# Patient Record
Sex: Male | Born: 1945
Health system: Southern US, Community
[De-identification: ages and names within clinical notes are randomized; demographics above are authoritative.]

## PROBLEM LIST (undated history)

## (undated) DIAGNOSIS — I10 Essential (primary) hypertension: Secondary | ICD-10-CM

---

## 2012-03-03 DIAGNOSIS — I10 Essential (primary) hypertension: Secondary | ICD-10-CM | POA: Diagnosis not present

## 2012-03-03 DIAGNOSIS — Z23 Encounter for immunization: Secondary | ICD-10-CM | POA: Diagnosis not present

## 2012-03-19 DIAGNOSIS — L738 Other specified follicular disorders: Secondary | ICD-10-CM | POA: Diagnosis not present

## 2012-05-30 DIAGNOSIS — E782 Mixed hyperlipidemia: Secondary | ICD-10-CM | POA: Diagnosis not present

## 2013-03-13 DIAGNOSIS — Z23 Encounter for immunization: Secondary | ICD-10-CM | POA: Diagnosis not present

## 2013-03-13 DIAGNOSIS — I10 Essential (primary) hypertension: Secondary | ICD-10-CM | POA: Diagnosis not present

## 2013-03-13 DIAGNOSIS — Z6827 Body mass index (BMI) 27.0-27.9, adult: Secondary | ICD-10-CM | POA: Diagnosis not present

## 2013-06-26 DEATH — deceased

## 2013-09-21 DIAGNOSIS — H6690 Otitis media, unspecified, unspecified ear: Secondary | ICD-10-CM | POA: Diagnosis not present

## 2013-09-21 DIAGNOSIS — Z6826 Body mass index (BMI) 26.0-26.9, adult: Secondary | ICD-10-CM | POA: Diagnosis not present

## 2013-09-21 DIAGNOSIS — J0100 Acute maxillary sinusitis, unspecified: Secondary | ICD-10-CM | POA: Diagnosis not present

## 2013-09-27 ENCOUNTER — Emergency Department (HOSPITAL_COMMUNITY)
Admission: EM | Admit: 2013-09-27 | Discharge: 2013-09-27 | Disposition: A | Discharge status: Home / Self Care | Payer: Medicare Other | Attending: Emergency Medicine | Admitting: Emergency Medicine

## 2013-09-27 ENCOUNTER — Encounter (HOSPITAL_COMMUNITY): Payer: Self-pay | Admitting: Emergency Medicine

## 2013-09-27 DIAGNOSIS — H65199 Other acute nonsuppurative otitis media, unspecified ear: Secondary | ICD-10-CM | POA: Insufficient documentation

## 2013-09-27 DIAGNOSIS — H65192 Other acute nonsuppurative otitis media, left ear: Secondary | ICD-10-CM

## 2013-09-27 DIAGNOSIS — R42 Dizziness and giddiness: Secondary | ICD-10-CM | POA: Diagnosis not present

## 2013-09-27 MED ORDER — ANTIPYRINE-BENZOCAINE 5.4-1.4 % OT SOLN: 3.0000 [drp] | Freq: Once | OTIC | Status: DC

## 2013-09-27 MED ORDER — NEOMYCIN-POLYMYXIN-HC 3.5-10000-1 OT SUSP
3.0000 [drp] | Freq: Once | OTIC | Status: AC
  Administered 2013-09-27: 3 [drp] via OTIC
  Filled 2013-09-27 (×2): qty 10

## 2013-09-27 MED ORDER — AMOXICILLIN-POT CLAVULANATE 875-125 MG PO TABS: 1.0000 | ORAL_TABLET | Freq: Two times a day (BID) | ORAL | Status: DC

## 2013-09-27 NOTE — Discharge Instructions (Signed)
Otitis Media °Otitis media is redness, soreness, and puffiness (swelling) in the space just behind your eardrum (middle ear). It may be caused by allergies or infection. It often happens along with a cold. °HOME CARE °· Take your medicine as told. Finish it even if you start to feel better. °· Only take over-the-counter or prescription medicines for pain, discomfort, or fever as told by your doctor. °· Follow up with your doctor as told. °GET HELP IF: °· You have otitis media only in one ear, or bleeding from your nose, or both. °· You notice a lump on your neck. °· You are not getting better in 3-5 days. °· You feel worse instead of better. °GET HELP RIGHT AWAY IF:  °· You have pain that is not helped with medicine. °· You have puffiness, redness, or pain around your ear. °· You get a stiff neck. °· You cannot move part of your face (paralysis). °· You notice that the bone behind your ear hurts when you touch it. °MAKE SURE YOU:  °· Understand these instructions. °· Will watch your condition. °· Will get help right away if you are not doing well or get worse. °Document Released: 08/31/2007 Document Revised: 03/19/2013 Document Reviewed: 10/09/2012 °[employer]® Patient Information ©2015 [employer]. This information is not intended to replace advice given to you by your health care provider. Make sure you discuss any questions you have with your health care provider. ° °

## 2013-09-27 NOTE — ED Notes (Signed)
Notified pharmacy for otic suspension which is not currently in pyxis

## 2013-09-27 NOTE — ED Provider Notes (Signed)
CSN: [id number]     Arrival date & time 09/27/13  0912 History   First MD Initiated Contact with Patient 09/27/13 [phone]     Chief Complaint  Patient presents with  . Otalgia     (Consider location/radiation/quality/duration/timing/severity/associated sxs/prior Treatment) Patient is a 68 y.o. male presenting with ear pain. The history is provided by the patient.  Otalgia Location:  Left Behind ear:  No abnormality Quality:  Pressure and aching Severity:  Moderate Onset quality:  Gradual Duration:  1 week Timing:  Constant Progression:  Unchanged Chronicity:  New Context: not direct blow, not foreign body in ear and not loud noise   Relieved by:  Nothing Worsened by:  Nothing tried Ineffective treatments: zithromax and "sweet oil" Associated symptoms: hearing loss   Associated symptoms: no abdominal pain, no congestion, no cough, no ear discharge, no fever, no headaches, no neck pain, no rash, no rhinorrhea, no sore throat, no tinnitus and no vomiting   Risk factors: no chronic ear infection     Patient reports decreased hearing and pain to the left ear for one week.  He reports having URI symptoms at onset, but that has since resolved.  He was seen by his PCP and given Z-pack and finished the medications w/o relief.  He also reports having intermittent dizziness and "poping " sensation to his ear.  He denies fever, vomiting, headaches, neck pain or drainage from the ear.   History reviewed. No pertinent past medical history. History reviewed. No pertinent past surgical history. History reviewed. No pertinent family history. History  Substance Use Topics  . Smoking status: Never Smoker   . Smokeless tobacco: Not on file  . Alcohol Use: No    Review of Systems  Constitutional: Negative for fever, chills, activity change and appetite change.  HENT: Positive for ear pain and hearing loss. Negative for congestion, ear discharge, facial swelling, rhinorrhea, sinus pressure,  sneezing, sore throat, tinnitus, trouble swallowing and voice change.   Eyes: Negative for visual disturbance.  Respiratory: Negative for cough, shortness of breath, wheezing and stridor.   Gastrointestinal: Negative for nausea, vomiting and abdominal pain.  Musculoskeletal: Negative for neck pain and neck stiffness.  Skin: Negative.  Negative for rash.  Neurological: Positive for dizziness. Negative for syncope, facial asymmetry, speech difficulty, weakness, numbness and headaches.  Hematological: Negative for adenopathy.  Psychiatric/Behavioral: Negative for confusion.  All other systems reviewed and are negative.     Allergies  Review of patient's allergies indicates no known allergies.  Home Medications   Prior to Admission medications   Not on File   BP 128/91  Pulse 69  Temp(Src) 98.2 F (36.8 C) (Oral)  Resp 18  Ht 6\' 3"  (1.905 m)  Wt 220 lb (99.791 kg)  BMI 27.50 kg/m2  SpO2 99% Physical Exam  Nursing note and vitals reviewed. Constitutional: He is oriented to person, place, and time. He appears well-developed and well-nourished. No distress.  HENT:  Head: Normocephalic and atraumatic.  Mouth/Throat: Uvula is midline, oropharynx is clear and moist and mucous membranes are normal. No uvula swelling. No oropharyngeal exudate.  Erythema mild bulging of the left TM.  Mild middle ear effusion present.  Scant amt of dried blood at the 3 o'clock position of the TM.  No active bleeding or obvious perforation.  No edema or drainage of the canal.  Neck: Normal range of motion. Neck supple.  Cardiovascular: Normal rate, regular rhythm, normal heart sounds and intact distal pulses.   No murmur heard.  Pulmonary/Chest: Effort normal and breath sounds normal. No stridor. No respiratory distress.  Lymphadenopathy:    He has no cervical adenopathy.  Neurological: He is alert and oriented to person, place, and time. Coordination normal.  Skin: Skin is warm and dry. No rash noted.     ED Course  Procedures (including critical care time) Labs Review Labs Reviewed - No data to display  Imaging Review No results found.   EKG Interpretation None      MDM   Final diagnoses:  Other acute nonsuppurative otitis media of left ear   Pt is well appearing, ambulates with steady gait.  VSS.  Left ear pain and decreased hearing for one week.  Has small amt of blood at the TM that could represent a possible perforation.  Pt dispensed cortisporin otic susp and agrees to ENT f/u if the sx's are not improving.  Ambulates with steady gait.  No dizziness or vertiginous sx's.  appears stable for /c    Wyn Nettle L. Trisha Mangleriplett, PA-C 09/29/13 1844

## 2013-09-27 NOTE — ED Notes (Signed)
Seen last Friday for his left ear being stopped up and cold symptoms.  Placed on an antibiodic and states his ear is still stopped up.

## 2013-09-30 NOTE — ED Provider Notes (Signed)
Medical screening examination/treatment/procedure(s) were performed by non-physician practitioner and as supervising physician I was immediately available for consultation/collaboration.   EKG Interpretation None        Joya Gaskinsonald W Paulina Muchmore, MD 09/30/13 [phone]

## 2014-06-16 DIAGNOSIS — I10 Essential (primary) hypertension: Secondary | ICD-10-CM | POA: Diagnosis not present

## 2014-06-16 DIAGNOSIS — E782 Mixed hyperlipidemia: Secondary | ICD-10-CM | POA: Diagnosis not present

## 2014-06-16 DIAGNOSIS — Z681 Body mass index (BMI) 19 or less, adult: Secondary | ICD-10-CM | POA: Diagnosis not present

## 2014-09-09 DIAGNOSIS — R42 Dizziness and giddiness: Secondary | ICD-10-CM | POA: Diagnosis not present

## 2014-10-14 DIAGNOSIS — H40033 Anatomical narrow angle, bilateral: Secondary | ICD-10-CM | POA: Diagnosis not present

## 2014-10-14 DIAGNOSIS — H04123 Dry eye syndrome of bilateral lacrimal glands: Secondary | ICD-10-CM | POA: Diagnosis not present

## 2015-07-23 DIAGNOSIS — E441 Mild protein-calorie malnutrition: Secondary | ICD-10-CM | POA: Diagnosis not present

## 2015-07-23 DIAGNOSIS — E782 Mixed hyperlipidemia: Secondary | ICD-10-CM | POA: Diagnosis not present

## 2015-07-23 DIAGNOSIS — E663 Overweight: Secondary | ICD-10-CM | POA: Diagnosis not present

## 2015-07-23 DIAGNOSIS — Z1389 Encounter for screening for other disorder: Secondary | ICD-10-CM | POA: Diagnosis not present

## 2015-07-23 DIAGNOSIS — Z125 Encounter for screening for malignant neoplasm of prostate: Secondary | ICD-10-CM | POA: Diagnosis not present

## 2015-07-23 DIAGNOSIS — I10 Essential (primary) hypertension: Secondary | ICD-10-CM | POA: Diagnosis not present

## 2015-07-23 DIAGNOSIS — Z6826 Body mass index (BMI) 26.0-26.9, adult: Secondary | ICD-10-CM | POA: Diagnosis not present

## 2016-06-05 ENCOUNTER — Encounter (HOSPITAL_COMMUNITY): Payer: Self-pay | Admitting: Emergency Medicine

## 2016-06-05 ENCOUNTER — Emergency Department (HOSPITAL_COMMUNITY)
Admission: EM | Admit: 2016-06-05 | Discharge: 2016-06-05 | Disposition: A | Discharge status: Home / Self Care | Payer: Medicare Other | Attending: Emergency Medicine | Admitting: Emergency Medicine

## 2016-06-05 DIAGNOSIS — I10 Essential (primary) hypertension: Secondary | ICD-10-CM | POA: Insufficient documentation

## 2016-06-05 DIAGNOSIS — Z79899 Other long term (current) drug therapy: Secondary | ICD-10-CM | POA: Insufficient documentation

## 2016-06-05 DIAGNOSIS — R42 Dizziness and giddiness: Secondary | ICD-10-CM

## 2016-06-05 DIAGNOSIS — E860 Dehydration: Secondary | ICD-10-CM

## 2016-06-05 HISTORY — DX: Essential (primary) hypertension: I10

## 2016-06-05 LAB — URINALYSIS, ROUTINE W REFLEX MICROSCOPIC
Bilirubin Urine: NEGATIVE
Glucose, UA: NEGATIVE mg/dL
Hgb urine dipstick: NEGATIVE
KETONES UR: NEGATIVE mg/dL
LEUKOCYTES UA: NEGATIVE
NITRITE: NEGATIVE
Protein, ur: NEGATIVE mg/dL
Specific Gravity, Urine: 1.012 (ref 1.005–1.030)
pH: 5 (ref 5.0–8.0)

## 2016-06-05 LAB — BASIC METABOLIC PANEL
Anion gap: 9 (ref 5–15)
BUN: 16 mg/dL (ref 6–20)
CO2: 26 mmol/L (ref 22–32)
CREATININE: 1.27 mg/dL — AB (ref 0.61–1.24)
Calcium: 9.3 mg/dL (ref 8.9–10.3)
Chloride: 103 mmol/L (ref 101–111)
GFR calc Af Amer: >60 mL/min (ref >60)
GFR, EST NON AFRICAN AMERICAN: 55 mL/min — AB (ref >60)
Glucose, Bld: 107 mg/dL — ABNORMAL HIGH (ref 65–99)
Potassium: 3.6 mmol/L (ref 3.5–5.1)
Sodium: 138 mmol/L (ref 135–145)

## 2016-06-05 LAB — CBG MONITORING, ED: Glucose-Capillary: 112 mg/dL — ABNORMAL HIGH (ref 65–99)

## 2016-06-05 LAB — CBC
HCT: 43.7 % (ref 39.0–52.0)
Hemoglobin: 14.7 g/dL (ref 13.0–17.0)
MCH: 31.5 pg (ref 26.0–34.0)
MCHC: 33.6 g/dL (ref 30.0–36.0)
MCV: 93.8 fL (ref 78.0–100.0)
Platelets: 222 10*3/uL (ref 150–400)
RBC: 4.66 MIL/uL (ref 4.22–5.81)
RDW: 12.1 % (ref 11.5–15.5)
WBC: 7.1 10*3/uL (ref 4.0–10.5)

## 2016-06-05 NOTE — ED Notes (Signed)
Pt reports that he went up for the alter call and was standing with his legs straight for about 10 minutes when he became dizzy-  He reports that he feels fine now- but his wife brought him here to be checked out

## 2016-06-05 NOTE — ED Notes (Signed)
Pt unable to stand at this time due to dizziness

## 2016-06-05 NOTE — ED Provider Notes (Signed)
[facility]-EMERGENCY DEPT Provider Note   CSN: [id number] Arrival date & time: 06/05/16  1318     History   Chief Complaint Chief Complaint  Patient presents with  . Dizziness    HPI Keith Phillips is a 71 y.o. male.  HPI Patient presents to the emergency department with episode of lightheadedness and dizziness while standing in church for 10 minutes straight.  Family reports the patient looked pale and weak.  He sat down and drink juice and began feeling better.  He denies recent illness.  He awoke and felt findings morning.  No preceding chest pain or palpitations.  No loss consciousness.  No history of similar symptoms.  He does drink coffee 3 times a day.  He did not drink anything this morning nor did he have coffee.  He did have a small breakfast.  No complaints at this time.  He feels fine lying in bed.  No melena or hematochezia described.  No use of anticoagulants.  No chest pain or chest pressure at this time.  No other complaints.  Family reports he appears normal to them at this time   Past Medical History:  Diagnosis Date  . Hypertension     There are no active problems to display for this patient.   History reviewed. No pertinent surgical history.     Home Medications    Prior to Admission medications   Medication Sig Start Date End Date Taking? Authorizing Provider  atenolol (TENORMIN) 50 MG tablet Take 50 mg by mouth daily.   Yes Historical Provider, MD  lisinopril-hydrochlorothiazide (PRINZIDE,ZESTORETIC) 20-12.5 MG tablet Take 1 tablet by mouth daily.   Yes Historical Provider, MD    Family History No family history on file.  Social History Social History  Substance Use Topics  . Smoking status: Never Smoker  . Smokeless tobacco: Never Used  . Alcohol use No     Allergies   Patient has no known allergies.   Review of Systems Review of Systems  All other systems reviewed and are negative.    Physical Exam Updated Vital Signs BP 128/68 (BP  Location: Right Arm)   Pulse 72   Temp 97.9 F (36.6 C)   Resp 19   Ht 6\' 3"  (1.905 m)   Wt 215 lb (97.5 kg)   SpO2 100%   BMI 26.87 kg/m   Physical Exam  Constitutional: He is oriented to person, place, and time. He appears well-developed and well-nourished.  HENT:  Head: Normocephalic and atraumatic.  Eyes: EOM are normal.  Neck: Normal range of motion.  Cardiovascular: Normal rate, regular rhythm, normal heart sounds and intact distal pulses.   Pulmonary/Chest: Effort normal and breath sounds normal. No respiratory distress.  Abdominal: Soft. He exhibits no distension. There is no tenderness.  Musculoskeletal: Normal range of motion.  Neurological: He is alert and oriented to person, place, and time.  Skin: Skin is warm and dry.  Psychiatric: He has a normal mood and affect. Judgment normal.  Nursing note and vitals reviewed.    ED Treatments / Results  Labs (all labs ordered are listed, but only abnormal results are displayed) Labs Reviewed  BASIC METABOLIC PANEL - Abnormal; Notable for the following:       Result Value   Glucose, Bld 107 (*)    Creatinine, Ser 1.27 (*)    GFR calc non Af Amer 55 (*)    All other components within normal limits  CBG MONITORING, ED - Abnormal; Notable for the following:  Glucose-Capillary 112 (*)    All other components within normal limits  CBC  URINALYSIS, ROUTINE W REFLEX MICROSCOPIC    EKG  EKG Interpretation None       Radiology No results found.  Procedures Procedures (including critical care time)  Medications Ordered in ED Medications - No data to display   Initial Impression / Assessment and Plan / ED Course  I have reviewed the triage vital signs and the nursing notes.  Pertinent labs & imaging results that were available during my care of the patient were reviewed by me and considered in my medical decision making (see chart for details).     Prolonged standing and likely orthostatic hypotension as  the cause of his near syncope.  Workup in emergency department was without abnormality.  He stands at the bedside now without any difficulty.  Is keeping fluids down.  I recommended increasing water intake at home and decreasing caffeine intake  Post primary care doctor follow-up.  He understands to return to the ER for new or worsening symptoms  Final Clinical Impressions(s) / ED Diagnoses   Final diagnoses:  Dizziness  Dehydration    New Prescriptions New Prescriptions   No medications on file     Azalia BilisKevin Hind Chesler, MD 06/05/16 1717

## 2016-06-05 NOTE — ED Triage Notes (Signed)
Pt c/o near syncopal episode in church with dizziness. pt family reports pt looked pale and weak. Pt states he sat down, drank juice and ate a piece of candy and began to feel better, cbg-104 after. Denies dizziness at present. Alert and oriented x 4, speech clear, grips/stregths equal x 4. Pt denies ha/cp.

## 2016-09-01 DIAGNOSIS — Z1389 Encounter for screening for other disorder: Secondary | ICD-10-CM | POA: Diagnosis not present

## 2016-09-01 DIAGNOSIS — Z0000 Encounter for general adult medical examination without abnormal findings: Secondary | ICD-10-CM | POA: Diagnosis not present

## 2016-09-01 DIAGNOSIS — I10 Essential (primary) hypertension: Secondary | ICD-10-CM | POA: Diagnosis not present

## 2016-09-01 DIAGNOSIS — Z6826 Body mass index (BMI) 26.0-26.9, adult: Secondary | ICD-10-CM | POA: Diagnosis not present

## 2016-09-01 DIAGNOSIS — Z125 Encounter for screening for malignant neoplasm of prostate: Secondary | ICD-10-CM | POA: Diagnosis not present

## 2016-10-20 DIAGNOSIS — H04123 Dry eye syndrome of bilateral lacrimal glands: Secondary | ICD-10-CM | POA: Diagnosis not present

## 2016-10-20 DIAGNOSIS — H40033 Anatomical narrow angle, bilateral: Secondary | ICD-10-CM | POA: Diagnosis not present

## 2017-05-01 ENCOUNTER — Telehealth (INDEPENDENT_AMBULATORY_CARE_PROVIDER_SITE_OTHER): Payer: Self-pay | Admitting: *Deleted

## 2017-05-01 NOTE — Telephone Encounter (Signed)
[name], I have never seen this patient

## 2017-05-01 NOTE — Telephone Encounter (Signed)
[facility] no answer left message.

## 2017-05-01 NOTE — Telephone Encounter (Signed)
Carollee HerterShannon from [facility] called stating the patient told her that Terri sent an order to have her hemoglobin recollected. Per Carollee HerterShannon they have not received that order and she can take a verbal order. Please call Carollee HerterShannon at [phone]

## 2017-10-26 ENCOUNTER — Other Ambulatory Visit: Payer: Self-pay

## 2018-04-11 DIAGNOSIS — Z0000 Encounter for general adult medical examination without abnormal findings: Secondary | ICD-10-CM | POA: Diagnosis not present

## 2018-04-11 DIAGNOSIS — E785 Hyperlipidemia, unspecified: Secondary | ICD-10-CM | POA: Diagnosis not present

## 2018-04-11 DIAGNOSIS — E663 Overweight: Secondary | ICD-10-CM | POA: Diagnosis not present

## 2018-04-11 DIAGNOSIS — Z0001 Encounter for general adult medical examination with abnormal findings: Secondary | ICD-10-CM | POA: Diagnosis not present

## 2018-04-11 DIAGNOSIS — Z6826 Body mass index (BMI) 26.0-26.9, adult: Secondary | ICD-10-CM | POA: Diagnosis not present

## 2018-04-11 DIAGNOSIS — Z1389 Encounter for screening for other disorder: Secondary | ICD-10-CM | POA: Diagnosis not present

## 2019-04-19 ENCOUNTER — Ambulatory Visit: Payer: Medicare Other | Attending: Internal Medicine

## 2019-04-19 DIAGNOSIS — Z23 Encounter for immunization: Secondary | ICD-10-CM

## 2019-04-19 NOTE — Progress Notes (Signed)
   Covid-19 Vaccination Clinic  Name:  Keith Phillips    MRN: [id number] DOB: 08/11/1945  04/19/2019  [name] was observed post Covid-19 immunization for 15 minutes without incidence. He was provided with Vaccine Information Sheet and instruction to access the V-Safe system.   [name] was instructed to call 911 with any severe reactions post vaccine: [name] Kitchen Difficulty breathing  . Swelling of your face and throat  . A fast heartbeat  . A bad rash all over your body  . Dizziness and weakness    Immunizations Administered    Name Date Dose VIS Date Route   Pfizer COVID-19 Vaccine 04/19/2019  8:28 AM 0.3 mL 03/08/2019 Intramuscular   Manufacturer: [employer], [employer]   Lot: [id number]   NDC: [address]-0

## 2019-05-09 ENCOUNTER — Ambulatory Visit: Payer: Medicare Other | Attending: Internal Medicine

## 2019-05-09 DIAGNOSIS — Z23 Encounter for immunization: Secondary | ICD-10-CM | POA: Insufficient documentation

## 2019-05-09 NOTE — Progress Notes (Signed)
   Covid-19 Vaccination Clinic  Name:  Keith Phillips    MRN: [id number] DOB: 02-Jan-1946  05/09/2019  Keith Phillips was observed post Covid-19 immunization for 15 minutes without incidence. He was provided with Vaccine Information Sheet and instruction to access the V-Safe system.   Keith Phillips was instructed to call 911 with any severe reactions post vaccine: [name] Kitchen Difficulty breathing  . Swelling of your face and throat  . A fast heartbeat  . A bad rash all over your body  . Dizziness and weakness    Immunizations Administered    Name Date Dose VIS Date Route   Pfizer COVID-19 Vaccine 05/09/2019 12:44 PM 0.3 mL 03/08/2019 Intramuscular   Manufacturer: [employer], [employer]   Lot: [id number]   NDC: [address]-8

## 2019-07-08 DIAGNOSIS — I10 Essential (primary) hypertension: Secondary | ICD-10-CM | POA: Diagnosis not present

## 2019-07-08 DIAGNOSIS — E663 Overweight: Secondary | ICD-10-CM | POA: Diagnosis not present

## 2019-07-08 DIAGNOSIS — Z0000 Encounter for general adult medical examination without abnormal findings: Secondary | ICD-10-CM | POA: Diagnosis not present

## 2019-07-08 DIAGNOSIS — Z1389 Encounter for screening for other disorder: Secondary | ICD-10-CM | POA: Diagnosis not present

## 2019-07-08 DIAGNOSIS — Z6827 Body mass index (BMI) 27.0-27.9, adult: Secondary | ICD-10-CM | POA: Diagnosis not present

## 2019-07-08 DIAGNOSIS — E7849 Other hyperlipidemia: Secondary | ICD-10-CM | POA: Diagnosis not present

## 2020-01-02 ENCOUNTER — Ambulatory Visit: Payer: Medicare Other | Attending: Internal Medicine

## 2020-01-02 DIAGNOSIS — Z23 Encounter for immunization: Secondary | ICD-10-CM

## 2020-01-02 NOTE — Progress Notes (Signed)
   Covid-19 Vaccination Clinic  Name:  Keith Phillips    MRN: [id number] DOB: May 20, 1945  01/02/2020  Keith Phillips was observed post Covid-19 immunization for 15 minutes without incident. He was provided with Vaccine Information Sheet and instruction to access the V-Safe system.   Keith Phillips was instructed to call 911 with any severe reactions post vaccine: [name] Kitchen Difficulty breathing  . Swelling of face and throat  . A fast heartbeat  . A bad rash all over body  . Dizziness and weakness

## 2020-12-03 ENCOUNTER — Emergency Department (HOSPITAL_COMMUNITY)
Admission: EM | Admit: 2020-12-03 | Discharge: 2020-12-04 | Disposition: A | Discharge status: Home / Self Care | Payer: Medicare Other | Attending: Emergency Medicine | Admitting: Emergency Medicine

## 2020-12-03 ENCOUNTER — Other Ambulatory Visit: Payer: Self-pay

## 2020-12-03 ENCOUNTER — Encounter (HOSPITAL_COMMUNITY): Payer: Self-pay

## 2020-12-03 DIAGNOSIS — R066 Hiccough: Secondary | ICD-10-CM | POA: Insufficient documentation

## 2020-12-03 DIAGNOSIS — Z79899 Other long term (current) drug therapy: Secondary | ICD-10-CM | POA: Diagnosis not present

## 2020-12-03 DIAGNOSIS — I10 Essential (primary) hypertension: Secondary | ICD-10-CM | POA: Insufficient documentation

## 2020-12-03 NOTE — ED Triage Notes (Signed)
Pt arrived via POV with c/o hiccups x 5 hours. Denies any pain.

## 2020-12-04 ENCOUNTER — Emergency Department (HOSPITAL_COMMUNITY): Payer: Medicare Other

## 2020-12-04 DIAGNOSIS — I10 Essential (primary) hypertension: Secondary | ICD-10-CM | POA: Diagnosis not present

## 2020-12-04 DIAGNOSIS — R066 Hiccough: Secondary | ICD-10-CM | POA: Diagnosis not present

## 2020-12-04 MED ORDER — ONDANSETRON 8 MG PO TBDP
8.0000 mg | ORAL_TABLET | Freq: Once | ORAL | Status: AC
Start: 2020-12-04 — End: 2020-12-04
  Administered 2020-12-04: 8 mg via ORAL
  Filled 2020-12-04: qty 1

## 2020-12-04 MED ORDER — PANTOPRAZOLE SODIUM 40 MG PO TBEC
40.0000 mg | DELAYED_RELEASE_TABLET | Freq: Once | ORAL | Status: AC
  Administered 2020-12-04: 40 mg via ORAL
  Filled 2020-12-04: qty 1

## 2020-12-04 MED ORDER — OMEPRAZOLE 20 MG PO CPDR: 20.0000 mg | DELAYED_RELEASE_CAPSULE | Freq: Every day | ORAL | 0 refills | Status: AC

## 2020-12-04 NOTE — ED Provider Notes (Signed)
[facility] EMERGENCY DEPARTMENT Provider Note   CSN: [id number] Arrival date & time: 12/03/20  2252     History Chief Complaint  Patient presents with   Hiccups    Keith Phillips is a 75 y.o. male.  The history is provided by the patient.  Patient presents for hiccups.  Patient reports he ate a cheese steak from [employer], and 30 minutes later he began having hiccups.  No chest pain or shortness of breath.  No fevers or vomiting.  No reflux symptoms are reported. He has tried home remedies without success His course is stable, nothing improves his symptoms    Past Medical History:  Diagnosis Date   Hypertension       Social History   Tobacco Use   Smoking status: Never   Smokeless tobacco: Never  Substance Use Topics   Alcohol use: No   Drug use: No    Home Medications Prior to Admission medications   Medication Sig Start Date End Date Taking? Authorizing Provider  omeprazole (PRILOSEC) 20 MG capsule Take 1 capsule (20 mg total) by mouth daily. 12/04/20  Yes Zadie Rhine, MD  atenolol (TENORMIN) 50 MG tablet Take 50 mg by mouth daily.    [provider]  lisinopril-hydrochlorothiazide (PRINZIDE,ZESTORETIC) 20-12.5 MG tablet Take 1 tablet by mouth daily.    [provider]    Allergies    Patient has no known allergies.  Review of Systems   Review of Systems  Constitutional:  Negative for fever.  HENT:  Negative for sore throat.   Respiratory:  Negative for shortness of breath.   Cardiovascular:  Negative for chest pain.  Gastrointestinal:  Negative for vomiting.   Physical Exam Updated Vital Signs BP 131/84   Pulse 61   Temp 98.6 F (37 C) (Oral)   Resp 18   Ht 1.905 m (6\' 3" )   Wt 95.3 kg   SpO2 99%   BMI 26.25 kg/m   Physical Exam CONSTITUTIONAL: Elderly, no acute distress HEAD: Normocephalic/atraumatic EYES: EOMI/PERRL ENMT: Mucous membranes moist, uvula midline no stridor, no drooling NECK: supple no meningeal signs CV:  S1/S2 noted, no murmurs/rubs/gallops noted LUNGS: Lungs are clear to auscultation bilaterally, no apparent distress, hiccups frequently during exam ABDOMEN: soft NEURO: Pt is awake/alert/appropriate, moves all extremitiesx4.  No facial droop.   EXTREMITIES: full ROM SKIN: warm, color normal PSYCH: no abnormalities of mood noted, alert and oriented to situation  ED Results / Procedures / Treatments   Labs (all labs ordered are listed, but only abnormal results are displayed) Labs Reviewed - No data to display  EKG EKG Interpretation  Date/Time:  Friday December 04 2020 00:26:54 EDT Ventricular Rate:  65 PR Interval:  200 QRS Duration: 99 QT Interval:  393 QTC Calculation: 409 R Axis:   -5 Text Interpretation: Sinus rhythm Low voltage, precordial leads Abnormal R-wave progression, early transition Baseline wander in lead(s) V1 V2 Interpretation limited secondary to artifact Confirmed by [id number] (Zadie Rhine) on 12/04/2020 12:44:09 AM  Radiology DG Chest Port 1 View  Result Date: 12/04/2020 CLINICAL DATA:  Hiccups EXAM: PORTABLE CHEST 1 VIEW COMPARISON:  None. FINDINGS: The heart size and mediastinal contours are within normal limits. Both lungs are clear. The visualized skeletal structures are unremarkable. IMPRESSION: No active disease. Electronically Signed   By: 02/03/2021 M.D.   On: 12/04/2020 00:57    Procedures Procedures   Medications Ordered in ED Medications  ondansetron (ZOFRAN-ODT) disintegrating tablet 8 mg (8 mg Oral Given 12/04/20 0112)  pantoprazole (PROTONIX) EC tablet 40 mg (40 mg Oral Given 12/04/20 0119)    ED Course  I have reviewed the triage vital signs and the nursing notes.  Pertinent l imaging results that were available during my care of the patient were reviewed by me and considered in my medical decision making (see chart for details).    MDM Rules/Calculators/A&P                           Patient presents for hiccups for the past 6-7 hours.   He is in no acute distress.  Plan to treat symptoms here and discharged home Screening chest x-ray was reviewed and is negative  Patient with hiccups for several hours.  No distress, watching television. Vital signs appropriate.  We will start a PPI, discharged home Final Clinical Impression(s) / ED Diagnoses Final diagnoses:  Hiccups    Rx / DC Orders ED Discharge Orders          Ordered    omeprazole (PRILOSEC) 20 MG capsule  Daily        12/04/20 0205             Zadie Rhine, MD 12/04/20 [phone]

## 2020-12-22 DIAGNOSIS — I10 Essential (primary) hypertension: Secondary | ICD-10-CM | POA: Diagnosis not present

## 2020-12-22 DIAGNOSIS — Z1331 Encounter for screening for depression: Secondary | ICD-10-CM | POA: Diagnosis not present

## 2020-12-22 DIAGNOSIS — E782 Mixed hyperlipidemia: Secondary | ICD-10-CM | POA: Diagnosis not present

## 2021-01-13 DIAGNOSIS — I10 Essential (primary) hypertension: Secondary | ICD-10-CM | POA: Diagnosis not present

## 2021-01-13 DIAGNOSIS — K6289 Other specified diseases of anus and rectum: Secondary | ICD-10-CM | POA: Diagnosis not present

## 2021-01-13 DIAGNOSIS — Z6826 Body mass index (BMI) 26.0-26.9, adult: Secondary | ICD-10-CM | POA: Diagnosis not present

## 2021-03-17 ENCOUNTER — Encounter (HOSPITAL_COMMUNITY): Payer: Self-pay

## 2021-03-17 ENCOUNTER — Emergency Department (HOSPITAL_COMMUNITY): Payer: Medicare Other

## 2021-03-17 ENCOUNTER — Emergency Department (HOSPITAL_COMMUNITY)
Admission: EM | Admit: 2021-03-17 | Discharge: 2021-03-17 | Disposition: A | Discharge status: Home / Self Care | Payer: Medicare Other | Attending: Emergency Medicine | Admitting: Emergency Medicine

## 2021-03-17 ENCOUNTER — Other Ambulatory Visit: Payer: Self-pay

## 2021-03-17 DIAGNOSIS — S060X0A Concussion without loss of consciousness, initial encounter: Secondary | ICD-10-CM | POA: Diagnosis not present

## 2021-03-17 DIAGNOSIS — M542 Cervicalgia: Secondary | ICD-10-CM | POA: Diagnosis not present

## 2021-03-17 DIAGNOSIS — W01198A Fall on same level from slipping, tripping and stumbling with subsequent striking against other object, initial encounter: Secondary | ICD-10-CM | POA: Insufficient documentation

## 2021-03-17 DIAGNOSIS — I10 Essential (primary) hypertension: Secondary | ICD-10-CM | POA: Insufficient documentation

## 2021-03-17 DIAGNOSIS — S0990XA Unspecified injury of head, initial encounter: Secondary | ICD-10-CM

## 2021-03-17 DIAGNOSIS — S0031XA Abrasion of nose, initial encounter: Secondary | ICD-10-CM | POA: Insufficient documentation

## 2021-03-17 DIAGNOSIS — Y92009 Unspecified place in unspecified non-institutional (private) residence as the place of occurrence of the external cause: Secondary | ICD-10-CM | POA: Insufficient documentation

## 2021-03-17 DIAGNOSIS — Z79899 Other long term (current) drug therapy: Secondary | ICD-10-CM | POA: Insufficient documentation

## 2021-03-17 DIAGNOSIS — S0083XA Contusion of other part of head, initial encounter: Secondary | ICD-10-CM | POA: Diagnosis not present

## 2021-03-17 NOTE — ED Notes (Signed)
Pt going for ct now

## 2021-03-17 NOTE — ED Triage Notes (Signed)
Pt arrived via POV following a fall at home where Pt reports falling apprx 35ft off porch when attempting to pick something up. Pt denies taking blood thinners and does present with hematoma above left eye. Pt denies LOC. Pt has No Hx of recent falls. Pts spouse reports Pts balance has been unsteady following the fall at home.

## 2021-03-17 NOTE — Discharge Instructions (Signed)
Apply ice to help with the swelling.  Take over-the-counter medications as needed for pain.  Return to the ER for confusion, vomiting or other concerning symptoms.

## 2021-03-17 NOTE — ED Provider Notes (Signed)
[facility] EMERGENCY DEPARTMENT Provider Note   CSN: [id number] Arrival date & time: 03/17/21  7425     History Chief Complaint  Patient presents with   Keith Phillips Reason is a 75 y.o. male.   Fall   Patient presents to the ED for evaluation of a head injury.  Patient states he was unloading some groceries.  He bent down while standing on his porch and ended up losing his balance and falling forward.  Patient does not have any railings on his porch so he struck his head on the concrete.  Patient did not lose consciousness but he has felt a little bit unsteady since the injury.  He does have a mild headache.  He denies any neck pain.  No numbness or weakness.  No injuries to his arms or legs.  Patient was feeling fine and not having any difficulty with his gait prior to the fall  Past Medical History:  Diagnosis Date   Hypertension     There are no problems to display for this patient.   History reviewed. No pertinent surgical history.     History reviewed. No pertinent family history.  Social History   Tobacco Use   Smoking status: Never   Smokeless tobacco: Never  Vaping Use   Vaping Use: Never used  Substance Use Topics   Alcohol use: No   Drug use: No    Home Medications Prior to Admission medications   Medication Sig Start Date End Date Taking? Authorizing Provider  atenolol (TENORMIN) 50 MG tablet Take 50 mg by mouth daily.   Yes [provider]  lisinopril-hydrochlorothiazide (PRINZIDE,ZESTORETIC) 20-12.5 MG tablet Take 1 tablet by mouth daily.   Yes [provider]  Probiotic Product (PROBIOTIC DAILY PO) Take 1 tablet by mouth daily.   Yes [provider]  ciprofloxacin (CIPRO) 500 MG tablet Take 500 mg by mouth 2 (two) times daily. Patient not taking: Reported on 03/17/2021 10/07/20   [provider]  omeprazole (PRILOSEC) 20 MG capsule Take 1 capsule (20 mg total) by mouth daily. Patient not taking: Reported on  03/17/2021 12/04/20   Zadie Rhine, MD    Allergies    Patient has no known allergies.  Review of Systems   Review of Systems  All other systems reviewed and are negative.  Physical Exam Updated Vital Signs BP 127/73    Pulse 81    Temp 99.7 F (37.6 C) (Oral)    Resp 18    Ht 1.905 m (6\' 3" )    Wt 95.3 kg    SpO2 97%    BMI 26.25 kg/m   Physical Exam Vitals and nursing note reviewed.  Constitutional:      General: He is not in acute distress.    Appearance: He is well-developed.  HENT:     Head: Normocephalic.     Comments: Hematoma left forehead, small abrasion to the nose, no nasal bone tenderness, no infraorbital tenderness    Right Ear: External ear normal.     Left Ear: External ear normal.  Eyes:     General: No scleral icterus.       Right eye: No discharge.        Left eye: No discharge.     Conjunctiva/sclera: Conjunctivae normal.  Neck:     Trachea: No tracheal deviation.  Cardiovascular:     Rate and Rhythm: Normal rate and regular rhythm.  Pulmonary:     Effort: Pulmonary effort is normal.  No respiratory distress.     Breath sounds: Normal breath sounds. No stridor. No wheezing or rales.  Abdominal:     General: Bowel sounds are normal. There is no distension.     Palpations: Abdomen is soft.     Tenderness: There is no abdominal tenderness. There is no guarding or rebound.  Musculoskeletal:        General: No tenderness or deformity.     Cervical back: Neck supple.     Comments: Entire spine nontender, no tenderness palpation upper or lower extremities, no deformity noted  Skin:    General: Skin is warm and dry.     Findings: No rash.  Neurological:     General: No focal deficit present.     Mental Status: He is alert.     Cranial Nerves: No cranial nerve deficit (no facial droop, extraocular movements intact, no slurred speech).     Sensory: No sensory deficit.     Motor: No abnormal muscle tone or seizure activity.     Coordination:  Coordination normal.  Psychiatric:        Mood and Affect: Mood normal.    ED Results / Procedures / Treatments   Labs (all labs ordered are listed, but only abnormal results are displayed) Labs Reviewed - No data to display  EKG None  Radiology CT Head Wo Contrast  Result Date: 03/17/2021 CLINICAL DATA:  Head trauma, moderate-severe; Neck trauma, mechanically unstable spine (Age >= 16y). Pt states he fell off porch hitting head tonight. Hematoma to left side of forehead noted. Denies LOC, denies neck pain EXAM: CT HEAD WITHOUT CONTRAST CT CERVICAL SPINE WITHOUT CONTRAST TECHNIQUE: Multidetector CT imaging of the head and cervical spine was performed following the standard protocol without intravenous contrast. Multiplanar CT image reconstructions of the cervical spine were also generated. COMPARISON:  None. FINDINGS: CT HEAD FINDINGS BRAIN: BRAIN Cerebral ventricle sizes are concordant with the degree of cerebral volume loss. Patchy and confluent areas of decreased attenuation are noted throughout the deep and periventricular white matter of the cerebral hemispheres bilaterally, compatible with chronic microvascular ischemic disease. No evidence of large-territorial acute infarction. No parenchymal hemorrhage. No mass lesion. No extra-axial collection. No mass effect or midline shift. No hydrocephalus. Basilar cisterns are patent. Vascular: No hyperdense vessel. Atherosclerotic calcifications are present within the cavernous internal carotid arteries. Skull: No acute fracture or focal lesion. Sinuses/Orbits: Paranasal sinuses and mastoid air cells are clear. The orbits are unremarkable. Other: Left periorbital soft tissue edema and hematoma formation. CT CERVICAL SPINE FINDINGS Alignment: Straightening of normal cervical lordosis likely due to positioning and degenerative changes. Skull base and vertebrae: Multilevel mild-to-moderate degenerative changes of the spine. No acute fracture. No  aggressive appearing focal osseous lesion or focal pathologic process. Soft tissues and spinal canal: No prevertebral fluid or swelling. No visible canal hematoma. Upper chest: Unremarkable. Other: None. IMPRESSION: 1. No acute intracranial abnormality. 2. No acute displaced fracture or traumatic listhesis of the cervical spine. Electronically Signed   By: Tish Frederickson M.D.   On: 03/17/2021 20:34   CT Cervical Spine Wo Contrast  Result Date: 03/17/2021 CLINICAL DATA:  Head trauma, moderate-severe; Neck trauma, mechanically unstable spine (Age >= 16y). Pt states he fell off porch hitting head tonight. Hematoma to left side of forehead noted. Denies LOC, denies neck pain EXAM: CT HEAD WITHOUT CONTRAST CT CERVICAL SPINE WITHOUT CONTRAST TECHNIQUE: Multidetector CT imaging of the head and cervical spine was performed following the standard protocol without intravenous contrast.  Multiplanar CT image reconstructions of the cervical spine were also generated. COMPARISON:  None. FINDINGS: CT HEAD FINDINGS BRAIN: BRAIN Cerebral ventricle sizes are concordant with the degree of cerebral volume loss. Patchy and confluent areas of decreased attenuation are noted throughout the deep and periventricular white matter of the cerebral hemispheres bilaterally, compatible with chronic microvascular ischemic disease. No evidence of large-territorial acute infarction. No parenchymal hemorrhage. No mass lesion. No extra-axial collection. No mass effect or midline shift. No hydrocephalus. Basilar cisterns are patent. Vascular: No hyperdense vessel. Atherosclerotic calcifications are present within the cavernous internal carotid arteries. Skull: No acute fracture or focal lesion. Sinuses/Orbits: Paranasal sinuses and mastoid air cells are clear. The orbits are unremarkable. Other: Left periorbital soft tissue edema and hematoma formation. CT CERVICAL SPINE FINDINGS Alignment: Straightening of normal cervical lordosis likely due to  positioning and degenerative changes. Skull base and vertebrae: Multilevel mild-to-moderate degenerative changes of the spine. No acute fracture. No aggressive appearing focal osseous lesion or focal pathologic process. Soft tissues and spinal canal: No prevertebral fluid or swelling. No visible canal hematoma. Upper chest: Unremarkable. Other: None. IMPRESSION: 1. No acute intracranial abnormality. 2. No acute displaced fracture or traumatic listhesis of the cervical spine. Electronically Signed   By: Tish Frederickson M.D.   On: 03/17/2021 20:34    Procedures Procedures   Medications Ordered in ED Medications - No data to display  ED Course  I have reviewed the triage vital signs and the nursing notes.  Pertinent labs & imaging results that were available during my care of the patient were reviewed by me and considered in my medical decision making (see chart for details).   MDM Rules/Calculators/A&P                         Head CT and C-spine CT without acute findings.  Symptoms consistent with concussion.  Fortunately no signs of serious head injury.  patient is alert awake and at his neurologic baseline.  Suspect balance disturbance related to his concussion.  Patient stable for discharge.  Warning signs precautions discussed.   Final Clinical Impression(s) / ED Diagnoses Final diagnoses:  Minor head injury, initial encounter  Concussion without loss of consciousness, initial encounter    Rx / DC Orders ED Discharge Orders     None        Linwood Dibbles, MD 03/17/21 2110

## 2021-04-22 ENCOUNTER — Encounter: Payer: Self-pay | Admitting: Internal Medicine

## 2021-05-26 ENCOUNTER — Ambulatory Visit: Payer: 59 | Admitting: Internal Medicine

## 2022-01-31 DIAGNOSIS — E782 Mixed hyperlipidemia: Secondary | ICD-10-CM | POA: Diagnosis not present

## 2022-01-31 DIAGNOSIS — I10 Essential (primary) hypertension: Secondary | ICD-10-CM | POA: Diagnosis not present

## 2022-01-31 DIAGNOSIS — Z0001 Encounter for general adult medical examination with abnormal findings: Secondary | ICD-10-CM | POA: Diagnosis not present

## 2022-01-31 DIAGNOSIS — Z1331 Encounter for screening for depression: Secondary | ICD-10-CM | POA: Diagnosis not present

## 2022-01-31 DIAGNOSIS — E663 Overweight: Secondary | ICD-10-CM | POA: Diagnosis not present

## 2022-01-31 DIAGNOSIS — Z6826 Body mass index (BMI) 26.0-26.9, adult: Secondary | ICD-10-CM | POA: Diagnosis not present

## 2022-01-31 DIAGNOSIS — Z125 Encounter for screening for malignant neoplasm of prostate: Secondary | ICD-10-CM | POA: Diagnosis not present

## 2023-02-02 DIAGNOSIS — H5213 Myopia, bilateral: Secondary | ICD-10-CM | POA: Diagnosis not present

## 2023-02-03 DIAGNOSIS — H524 Presbyopia: Secondary | ICD-10-CM | POA: Diagnosis not present

## 2023-02-14 DIAGNOSIS — Z6827 Body mass index (BMI) 27.0-27.9, adult: Secondary | ICD-10-CM | POA: Diagnosis not present

## 2023-02-14 DIAGNOSIS — E782 Mixed hyperlipidemia: Secondary | ICD-10-CM | POA: Diagnosis not present

## 2023-02-14 DIAGNOSIS — I10 Essential (primary) hypertension: Secondary | ICD-10-CM | POA: Diagnosis not present

## 2023-02-14 DIAGNOSIS — E663 Overweight: Secondary | ICD-10-CM | POA: Diagnosis not present

## 2023-11-13 IMAGING — CT CT HEAD W/O CM
3 series · 14 of 47 positions shown, 16 images · non-contrast
Comparison: None.

CLINICAL DATA: Head trauma, moderate-severe; Neck trauma,
mechanically unstable spine (Age >= 16y). Pt states he fell off
porch hitting head tonight. Hematoma to left side of forehead noted.
Denies LOC, denies neck pain

EXAM:
CT HEAD WITHOUT CONTRAST
CT CERVICAL SPINE WITHOUT CONTRAST
TECHNIQUE: Multidetector CT imaging of the head and cervical spine was
performed following the standard protocol without intravenous
contrast. Multiplanar CT image reconstructions of the cervical spine
were also generated.

[Series 2: head w o · axial · 0.44mm/px · z∈[+36,+176]mm · 8 of 34 slices shown, 10 images]
[im 3/34  brain]
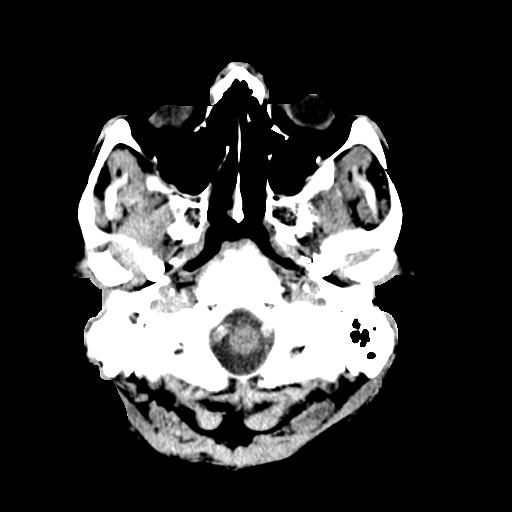
[im 3/34  bone]
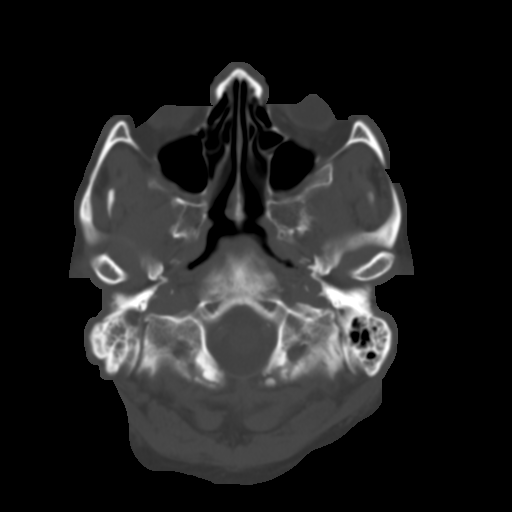
[im 7/34  brain]
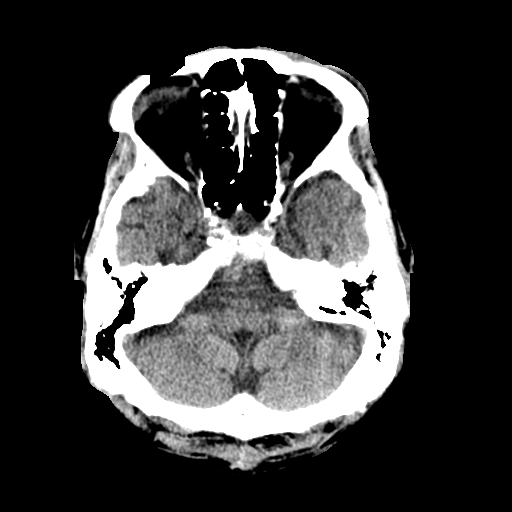
[im 11/34  brain]
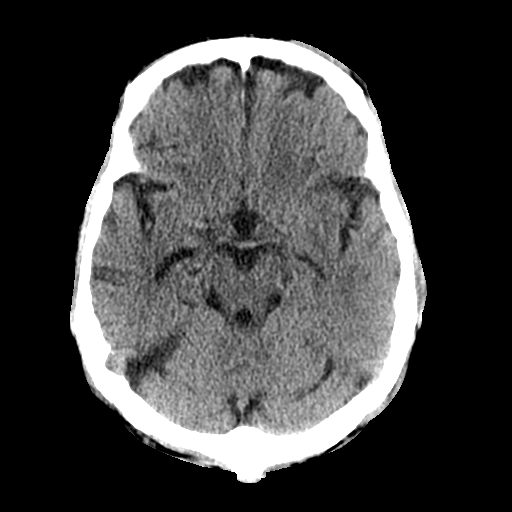
[im 15/34  brain]
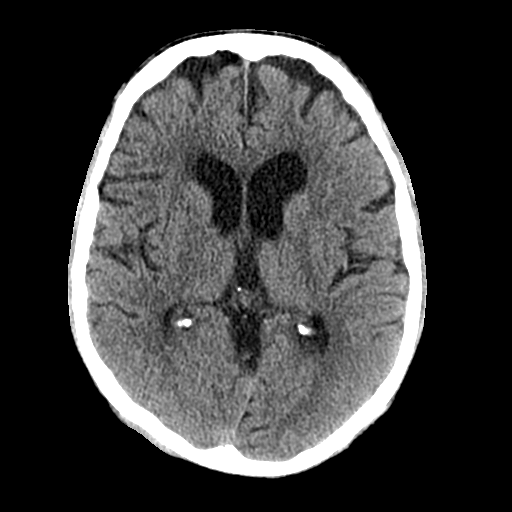
[im 19/34  brain]
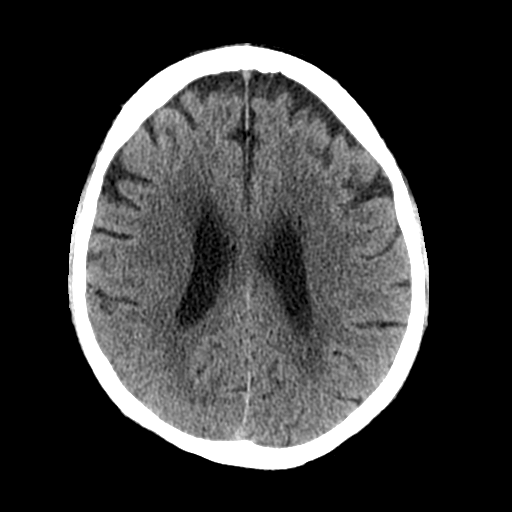
[im 19/34  bone]
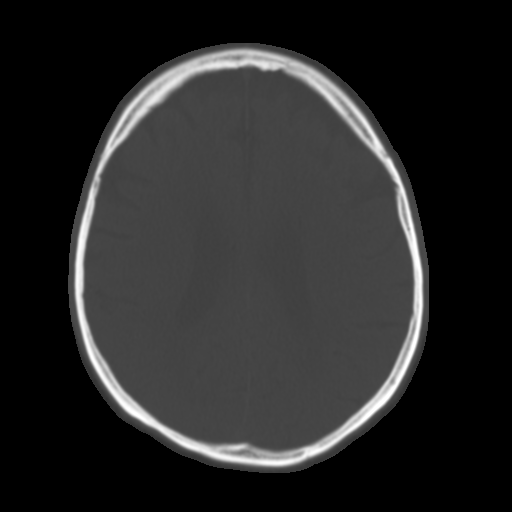
[im 23/34  brain]
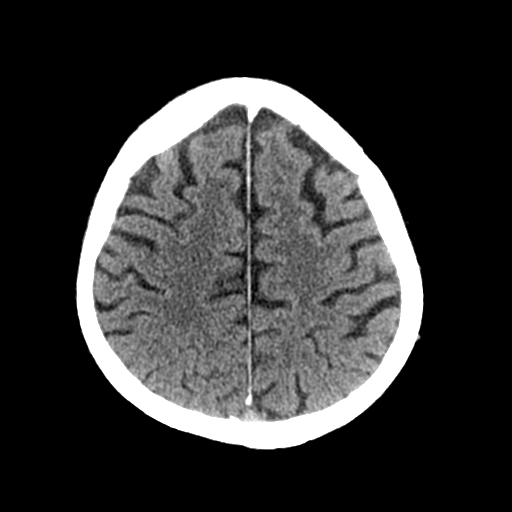
[im 27/34  brain]
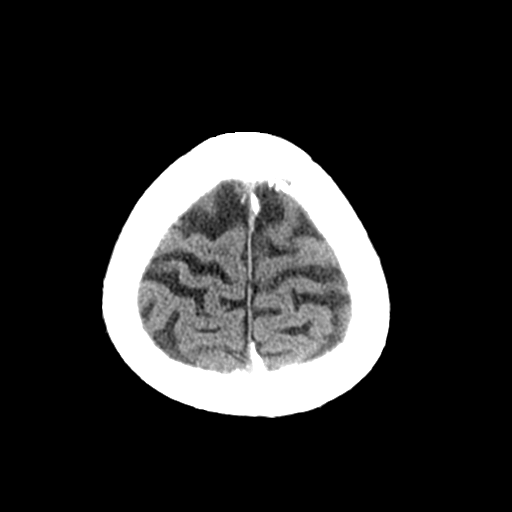
[im 31/34  brain]
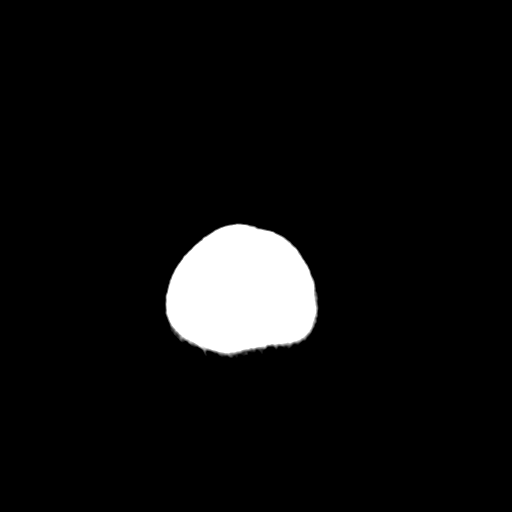

[Series 4: coronal soft · coronal · 0.33mm/px · 3 of 69 slices shown]
[im 23/69  brain]
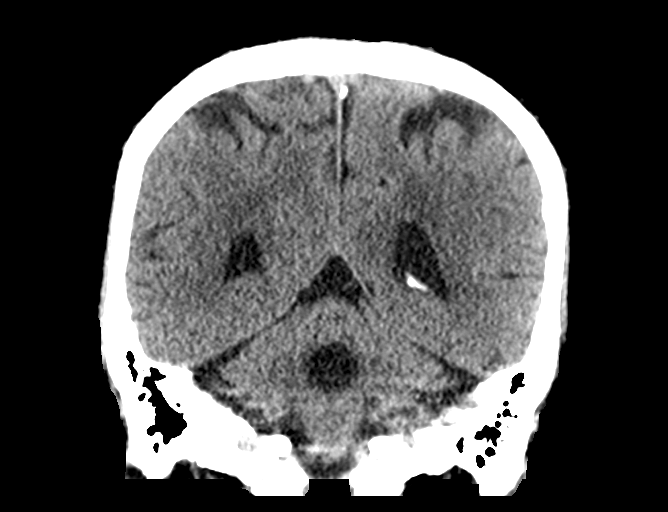
[im 31/69  brain]
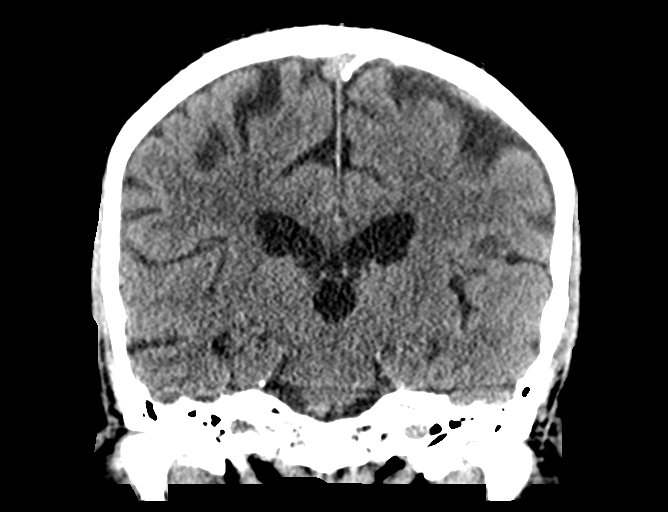
[im 38/69  brain]
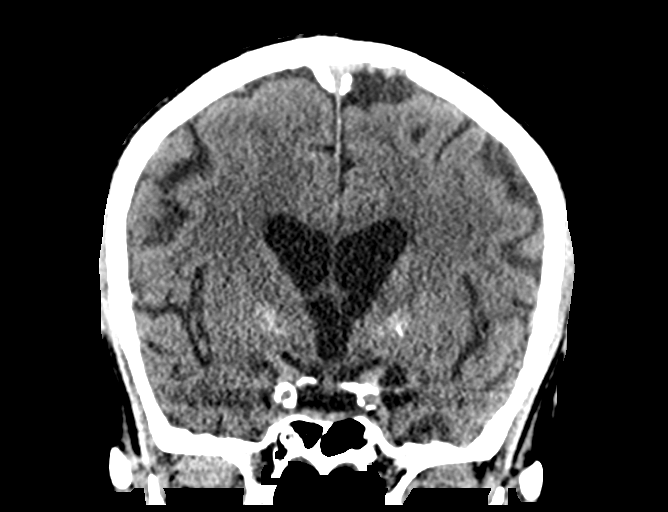

[Series 5: sagittal soft · sagittal · 0.33mm/px · 3 of 67 slices shown]
[im 23/67  brain]
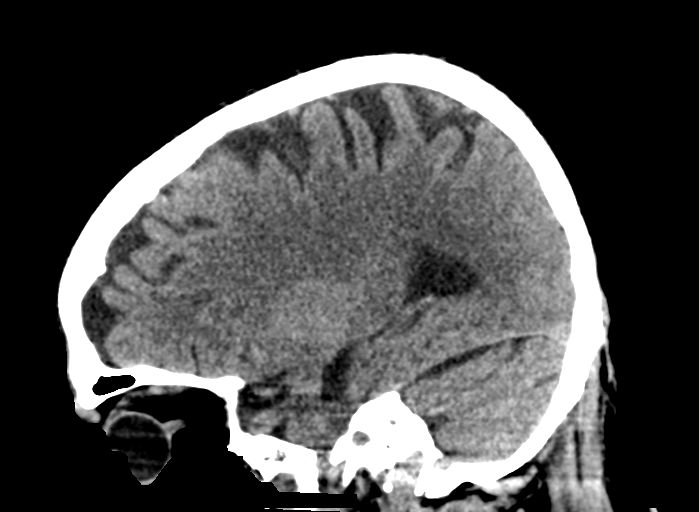
[im 34/67  brain]
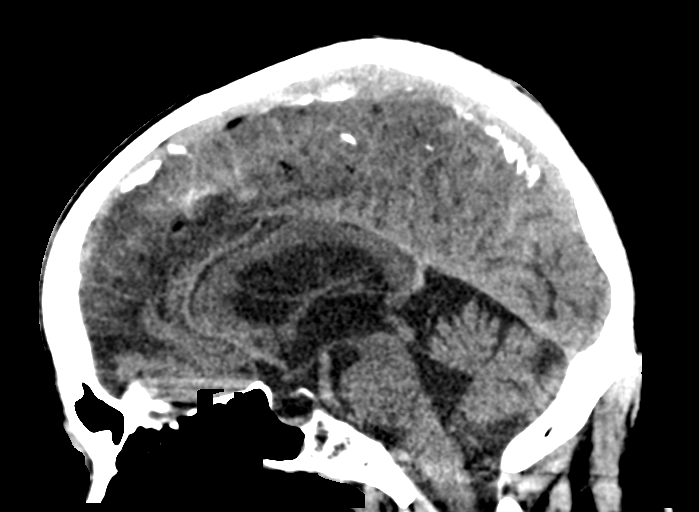
[im 45/67  brain]
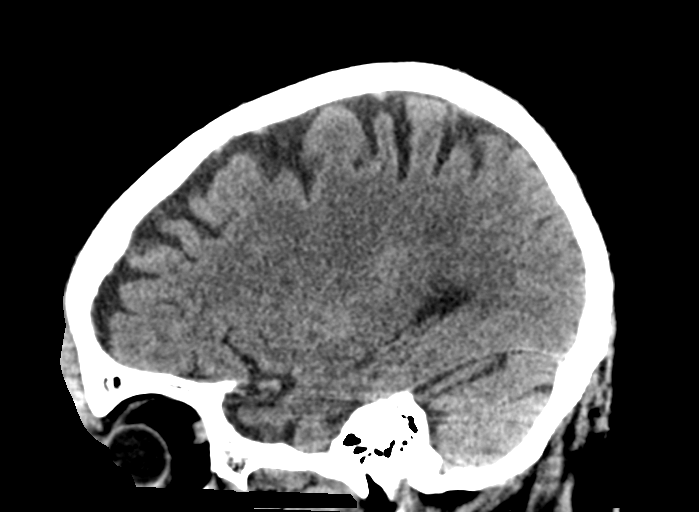

[14 of 47 positions shown; findings below may reference images not displayed]

FINDINGS: CT HEAD FINDINGS

BRAIN:
BRAIN
Cerebral ventricle sizes are concordant with the degree of cerebral
volume loss. Patchy and confluent areas of decreased attenuation are
noted throughout the deep and periventricular white matter of the
cerebral hemispheres bilaterally, compatible with chronic
microvascular ischemic disease.

No evidence of large-territorial acute infarction. No parenchymal
hemorrhage. No mass lesion. No extra-axial collection.

No mass effect or midline shift. No hydrocephalus. Basilar cisterns
are patent.

Vascular: No hyperdense vessel. Atherosclerotic calcifications are
present within the cavernous internal carotid arteries.

Skull: No acute fracture or focal lesion.

Sinuses/Orbits: Paranasal sinuses and mastoid air cells are clear.
The orbits are unremarkable.

Other: Left periorbital soft tissue edema and hematoma formation.

CT CERVICAL SPINE FINDINGS

Alignment: Straightening of normal cervical lordosis likely due to
positioning and degenerative changes.

Skull base and vertebrae: Multilevel mild-to-moderate degenerative
changes of the spine. No acute fracture. No aggressive appearing
focal osseous lesion or focal pathologic process.

Soft tissues and spinal canal: No prevertebral fluid or swelling. No
visible canal hematoma.

Upper chest: Unremarkable.

Other: None.
IMPRESSION: 1. No acute intracranial abnormality.
2. No acute displaced fracture or traumatic listhesis of the
cervical spine.

## 2024-01-09 DIAGNOSIS — R972 Elevated prostate specific antigen [PSA]: Secondary | ICD-10-CM | POA: Diagnosis not present

## 2024-01-09 DIAGNOSIS — E785 Hyperlipidemia, unspecified: Secondary | ICD-10-CM | POA: Diagnosis not present

## 2024-01-09 DIAGNOSIS — I10 Essential (primary) hypertension: Secondary | ICD-10-CM | POA: Diagnosis not present

## 2024-04-29 ENCOUNTER — Emergency Department (HOSPITAL_COMMUNITY)

## 2024-04-29 ENCOUNTER — Other Ambulatory Visit: Payer: Self-pay

## 2024-04-29 ENCOUNTER — Emergency Department (HOSPITAL_COMMUNITY)
Admission: EM | Admit: 2024-04-29 | Discharge: 2024-04-29 | Disposition: A | Discharge status: Home / Self Care | Attending: Emergency Medicine | Admitting: Emergency Medicine

## 2024-04-29 DIAGNOSIS — Z79899 Other long term (current) drug therapy: Secondary | ICD-10-CM | POA: Insufficient documentation

## 2024-04-29 DIAGNOSIS — W19XXXA Unspecified fall, initial encounter: Secondary | ICD-10-CM

## 2024-04-29 DIAGNOSIS — W000XXA Fall on same level due to ice and snow, initial encounter: Secondary | ICD-10-CM | POA: Insufficient documentation

## 2024-04-29 DIAGNOSIS — I10 Essential (primary) hypertension: Secondary | ICD-10-CM | POA: Insufficient documentation

## 2024-04-29 DIAGNOSIS — Y92810 Car as the place of occurrence of the external cause: Secondary | ICD-10-CM | POA: Insufficient documentation

## 2024-04-29 DIAGNOSIS — S89209A Unspecified physeal fracture of upper end of unspecified fibula, initial encounter for closed fracture: Secondary | ICD-10-CM

## 2024-04-29 DIAGNOSIS — M7989 Other specified soft tissue disorders: Secondary | ICD-10-CM | POA: Insufficient documentation

## 2024-04-29 DIAGNOSIS — S82431A Displaced oblique fracture of shaft of right fibula, initial encounter for closed fracture: Secondary | ICD-10-CM | POA: Insufficient documentation

## 2024-04-29 MED ORDER — MORPHINE SULFATE (PF) 4 MG/ML IV SOLN
4.0000 mg | Freq: Once | INTRAVENOUS | Status: DC
  Filled 2024-04-29: qty 1

## 2024-04-29 MED ORDER — ONDANSETRON 4 MG PO TBDP
4.0000 mg | ORAL_TABLET | Freq: Once | ORAL | Status: AC
  Administered 2024-04-29: 4 mg via ORAL
  Filled 2024-04-29: qty 1

## 2024-04-29 MED ORDER — HYDROCODONE-ACETAMINOPHEN 5-325 MG PO TABS: 1.0000 | ORAL_TABLET | ORAL | 0 refills | Status: AC | PRN

## 2024-04-29 NOTE — ED Triage Notes (Addendum)
 Patient arrives with [facility] from home c/c fall. Patient was getting out of his car and slipped on the ice. Denies head injury or LOC. Currently endorses inability to bear weight on his R leg. Went to urgent care and was told he has a tibial fracture and to follow up with ortho. No pain meds ordered per patient.
